# Patient Record
Sex: Male | Born: 1990 | Race: White | Hispanic: No | Marital: Single | Smoking: Current every day smoker
Health system: Southern US, Community
[De-identification: ages and names within clinical notes are randomized; demographics above are authoritative.]

## PROBLEM LIST (undated history)

## (undated) HISTORY — PX: OTHER SURGICAL HISTORY: SHX169

---

## 2010-08-26 ENCOUNTER — Emergency Department (HOSPITAL_COMMUNITY)
Admission: EM | Admit: 2010-08-26 | Discharge: 2010-08-26 | Disposition: A | Discharge status: Home / Self Care | Payer: Medicaid Other | Attending: Emergency Medicine | Admitting: Emergency Medicine

## 2010-08-26 DIAGNOSIS — M545 Low back pain, unspecified: Secondary | ICD-10-CM | POA: Insufficient documentation

## 2010-08-26 DIAGNOSIS — M5430 Sciatica, unspecified side: Secondary | ICD-10-CM | POA: Insufficient documentation

## 2010-08-27 ENCOUNTER — Emergency Department (HOSPITAL_COMMUNITY)
Admission: EM | Admit: 2010-08-27 | Discharge: 2010-08-28 | Disposition: A | Discharge status: Home / Self Care | Payer: Medicaid Other | Attending: Emergency Medicine | Admitting: Emergency Medicine

## 2010-08-27 DIAGNOSIS — M545 Low back pain, unspecified: Secondary | ICD-10-CM | POA: Insufficient documentation

## 2010-08-27 DIAGNOSIS — M79609 Pain in unspecified limb: Secondary | ICD-10-CM | POA: Insufficient documentation

## 2010-08-27 DIAGNOSIS — M5430 Sciatica, unspecified side: Secondary | ICD-10-CM | POA: Insufficient documentation

## 2010-08-27 DIAGNOSIS — Z79899 Other long term (current) drug therapy: Secondary | ICD-10-CM | POA: Insufficient documentation

## 2010-08-27 DIAGNOSIS — M5380 Other specified dorsopathies, site unspecified: Secondary | ICD-10-CM | POA: Insufficient documentation

## 2010-08-27 DIAGNOSIS — R209 Unspecified disturbances of skin sensation: Secondary | ICD-10-CM | POA: Insufficient documentation

## 2010-09-04 ENCOUNTER — Other Ambulatory Visit (HOSPITAL_COMMUNITY): Payer: Self-pay | Admitting: Specialist

## 2010-09-04 DIAGNOSIS — M5126 Other intervertebral disc displacement, lumbar region: Secondary | ICD-10-CM

## 2010-09-05 ENCOUNTER — Ambulatory Visit (HOSPITAL_COMMUNITY)
Admission: RE | Admit: 2010-09-05 | Discharge: 2010-09-05 | Disposition: A | Discharge status: Home / Self Care | Payer: Medicaid Other | Source: Ambulatory Visit | Attending: Specialist | Admitting: Specialist

## 2010-09-05 ENCOUNTER — Other Ambulatory Visit (HOSPITAL_COMMUNITY): Payer: Self-pay | Admitting: Specialist

## 2010-09-05 ENCOUNTER — Emergency Department (HOSPITAL_COMMUNITY)
Admission: EM | Admit: 2010-09-05 | Discharge: 2010-09-05 | Disposition: A | Discharge status: Home / Self Care | Payer: Medicaid Other | Attending: Emergency Medicine | Admitting: Emergency Medicine

## 2010-09-05 DIAGNOSIS — M545 Low back pain, unspecified: Secondary | ICD-10-CM | POA: Insufficient documentation

## 2010-09-05 DIAGNOSIS — R209 Unspecified disturbances of skin sensation: Secondary | ICD-10-CM | POA: Insufficient documentation

## 2010-09-05 DIAGNOSIS — M5126 Other intervertebral disc displacement, lumbar region: Secondary | ICD-10-CM

## 2010-09-05 DIAGNOSIS — M519 Unspecified thoracic, thoracolumbar and lumbosacral intervertebral disc disorder: Secondary | ICD-10-CM | POA: Insufficient documentation

## 2010-09-05 DIAGNOSIS — M79609 Pain in unspecified limb: Secondary | ICD-10-CM | POA: Insufficient documentation

## 2010-09-05 DIAGNOSIS — R29898 Other symptoms and signs involving the musculoskeletal system: Secondary | ICD-10-CM | POA: Insufficient documentation

## 2010-09-05 DIAGNOSIS — E669 Obesity, unspecified: Secondary | ICD-10-CM | POA: Insufficient documentation

## 2010-09-12 ENCOUNTER — Emergency Department (HOSPITAL_COMMUNITY)
Admission: EM | Admit: 2010-09-12 | Discharge: 2010-09-12 | Disposition: A | Discharge status: Home / Self Care | Payer: Medicaid Other | Attending: Emergency Medicine | Admitting: Emergency Medicine

## 2010-09-12 DIAGNOSIS — R000 Tachycardia, unspecified: Secondary | ICD-10-CM | POA: Insufficient documentation

## 2010-09-12 DIAGNOSIS — M545 Low back pain, unspecified: Secondary | ICD-10-CM | POA: Insufficient documentation

## 2010-09-12 DIAGNOSIS — E669 Obesity, unspecified: Secondary | ICD-10-CM | POA: Insufficient documentation

## 2010-09-12 DIAGNOSIS — R3916 Straining to void: Secondary | ICD-10-CM | POA: Insufficient documentation

## 2010-09-12 DIAGNOSIS — M5126 Other intervertebral disc displacement, lumbar region: Secondary | ICD-10-CM | POA: Insufficient documentation

## 2010-09-15 ENCOUNTER — Encounter (HOSPITAL_COMMUNITY)
Admission: RE | Admit: 2010-09-15 | Discharge: 2010-09-15 | Disposition: A | Discharge status: Home / Self Care | Payer: Medicaid Other | Source: Ambulatory Visit | Attending: Neurological Surgery | Admitting: Neurological Surgery

## 2010-09-15 LAB — BASIC METABOLIC PANEL
CO2: 32 mEq/L (ref 19–32)
Chloride: 101 mEq/L (ref 96–112)
Creatinine, Ser: 0.94 mg/dL (ref 0.4–1.5)
GFR calc Af Amer: >60 mL/min (ref >60)
Glucose, Bld: 101 mg/dL — ABNORMAL HIGH (ref 70–99)

## 2010-09-15 LAB — DIFFERENTIAL
Basophils Relative: 1 % (ref 0–1)
Eosinophils Relative: 5 % (ref 0–5)
Lymphs Abs: 2.8 10*3/uL (ref 0.7–4.0)
Monocytes Relative: 13 % — ABNORMAL HIGH (ref 3–12)
Neutro Abs: 3.7 10*3/uL (ref 1.7–7.7)

## 2010-09-15 LAB — CBC
HCT: 40.4 % (ref 39.0–52.0)
Hemoglobin: 14.7 g/dL (ref 13.0–17.0)
MCH: 30.9 pg (ref 26.0–34.0)
MCV: 85.1 fL (ref 78.0–100.0)
RBC: 4.75 MIL/uL (ref 4.22–5.81)

## 2010-09-15 LAB — SURGICAL PCR SCREEN: MRSA, PCR: NEGATIVE

## 2010-09-15 LAB — PROTIME-INR: INR: 0.99 (ref 0.00–1.49)

## 2010-09-18 ENCOUNTER — Ambulatory Visit (HOSPITAL_COMMUNITY): Payer: Medicaid Other

## 2010-09-18 ENCOUNTER — Ambulatory Visit (HOSPITAL_COMMUNITY)
Admission: RE | Admit: 2010-09-18 | Discharge: 2010-09-18 | Disposition: A | Discharge status: Home / Self Care | Payer: Medicaid Other | Source: Ambulatory Visit | Attending: Neurological Surgery | Admitting: Neurological Surgery

## 2010-09-18 DIAGNOSIS — M5126 Other intervertebral disc displacement, lumbar region: Secondary | ICD-10-CM | POA: Insufficient documentation

## 2010-09-18 DIAGNOSIS — E669 Obesity, unspecified: Secondary | ICD-10-CM | POA: Insufficient documentation

## 2010-09-18 DIAGNOSIS — Z01812 Encounter for preprocedural laboratory examination: Secondary | ICD-10-CM | POA: Insufficient documentation

## 2010-09-18 DIAGNOSIS — F17200 Nicotine dependence, unspecified, uncomplicated: Secondary | ICD-10-CM | POA: Insufficient documentation

## 2010-09-22 NOTE — Consult Note (Signed)
NAMEEMILY, FORSE NO.:  [id number]  MEDICAL RECORD NO.:  [id number]           PATIENT TYPE:  LOCATION:                                 FACILITY:  PHYSICIAN:  Tia Alert, MD     DATE OF BIRTH:  18-May-1991  DATE OF CONSULTATION:  09/05/2010 DATE OF DISCHARGE:                                CONSULTATION   CHIEF COMPLAINT:  Large lumbar disk herniation.  HISTORY OF PRESENT ILLNESS:  Mr. Brault is a 20 year old gentleman who is seen in neurosurgical consultation requested by the physician in the emergency department regarding a large lumbar disk herniation.  The patient states that he started hurting about a month ago in his back and in his legs.  The pain is somewhat progressive.  He has had some paresthesias, but no weakness, no bowel and bladder changes.  He has had no numbness in his perineum, no difficulty with urination or ambulation. He had an MRI which showed a large disk protrusion centrally at L4-5 with a free fragment causing severe spinal stenosis at L4-5 and he was sent to the emergency department for evaluation by local orthopedic surgeon.  He has been taking Percocet without significant relief.  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  MEDICATIONS:  Percocet and Advil.  ALLERGIES:  No known drug allergies.  SOCIAL HISTORY:  Positive use of tobacco.  No alcohol or IV drug abuse.  PHYSICAL EXAMINATION:  GENERAL:  Pleasant, cooperative, obese white male, in no acute distress, lying in stretcher. HEENT:  Normocephalic, atraumatic.  Extraocular movements are intact. NECK:  Supple. HEART:  Regular rhythm. EXTREMITIES:  No obvious deformities. NEUROLOGIC:  He is awake and alert, pleasant, interactive.  No aphasia. Good attention span.  His fund of knowledge and memory appear to be appropriate.  No facial  asymmetry.  Extraocular movements are intact. Pupils are equal and reactive.  His strength is 5/5 throughout with good muscle tone, good muscle bulk.  No evidence of EHL or dorsiflexion weakness.  Positive straight leg raise test.  Gait is not tested.  IMAGING STUDIES:  MRI of the lumbar spine shows a large central disk protrusion at L4-5.  There is disk desiccation at L4-5 and L5-S1.  There is significant spinal stenosis at L4-5.  There is a large free fragment, more paracentral to the right.  ASSESSMENT AND PLAN:  I do believe this 20 year old gentleman unfortunately needs surgical intervention.  I do not think he needs emergency or urgent surgery, and therefore we are going to let him go home and follow up with me to schedule surgery in the next week or two. He is to call me should he develop any weakness in his lower extremities or any signs of cauda equina syndrome, which has been explained to him in depth.  We have talked about the surgery in general.  We have talked about typical outcomes and recovery times.  We have talked about activity modifications after surgery  and work restrictions.  We have talked about the risk of the surgery which include but are not limited to bleeding, infection, nerve root injury, CSF leak, numbness, weakness, possible need for further surgery, lack of relief of pain, and anesthesia risk including DVT and death.  He understands these things and wishes to proceed, and he will be called upon my office in the next few days to schedule.     Tia Alert, MD     DSJ/MEDQ  D:  09/05/2010  T:  09/06/2010  Job:  [id number]  Electronically Signed by Marikay Alar MD on 09/22/2010 10:46:35 AM

## 2010-09-22 NOTE — Op Note (Signed)
NAMECALEY, VOLKERT               ACCOUNT NO.:  [id number]  MEDICAL RECORD NO.:  [id number]           PATIENT TYPE:  I  LOCATION:  3524                         FACILITY:  [facility]  PHYSICIAN:  Tia Alert, MD     DATE OF BIRTH:  Nov 27, 1990  DATE OF PROCEDURE:  09/18/2010 DATE OF DISCHARGE:  09/18/2010                              OPERATIVE REPORT   PREOPERATIVE DIAGNOSIS:  Large L4-5 herniated disk with severe spinal stenosis at L4-5 with back and right much greater than left leg pain.  POSTOPERATIVE DIAGNOSIS:  Large L4-5 herniated disk with severe spinal stenosis at L4-5 with back and right much greater than left leg pain.  PROCEDURES:  Right L4-5 hemilaminectomy, medial facetectomy, foraminotomy followed by microdiskectomy at L4-5 on the right utilizing microscopic dissection.  SURGEON:  Tia Alert, MD  ASSISTANT:  Reinaldo Meeker, MD  ANESTHESIA:  General endotracheal.  COMPLICATIONS:  None apparent.  INDICATIONS FOR PROCEDURE:  Mr. Haig is a 20 year old gentleman who presented to the emergency department with severe back and leg pain.  He had right greater than left leg pain.  He had an MRI which showed a large herniated disk at L4-5 with a large free fragment causing severe spinal stenosis at L4-5.  He was originally taken care of by orthopedic surgeons but they called and asked me to assume his care because they felt he needed surgical intervention.  Given the severity of his stenosis and the length of his pain, I agreed that he was best treated by surgical intervention even given his young age.  At first, I decided on bilateral hemilaminectomy with a right-sided microdiskectomy, however, intraoperatively, once we removed the large midline free fragment, the dura relaxed, the annulus sagged, and we felt like we had an adequate decompression of the canal and the nerve roots with simple microdiskectomy from the right side.  The patient did understand  the risks, benefits, and alternatives prior to proceeding and wished to proceed.  DESCRIPTION OF THE PROCEDURE:  The patient was taken to the operating room.  After induction of adequate generalized endotracheal anesthesia, he was rolled in prone position on the Wilson frame.  All pressure points were padded.  His lumbar region was prepped with DuraPrep and then draped in the usual sterile fashion.  A 5 mL of local anesthesia was injected and a dorsal midline incision was made over L4-5 and carried down to the lumbosacral fascia.  The fascia was opened on both sides and taken down in a subperiosteal fashion to expose L4-5. Intraoperative x-ray confirmed our level.  I had planned to do bilateral hemilaminectomies but I started on the right-hand side and utilizing the high-speed drill and the Kerrison punches, performed a hemilaminectomy, medial facetectomy with foraminotomy at L4-5 on the right.  The underlying yellow ligament was opened and removed, undercut the lateral recess, performed a generous foraminotomy marching down to this distal part of the pedicle.  There was an obvious large disk herniation that was noted that was pushing the dura medially and superiorly.  I was able to open this with a nerve hook and then  remove a very large fragment from the midline and right paracentral region and the dura immediately relaxed.  There was a large annular tear and hole.  I was also able to perform a thorough intradiscal diskectomy through this utilizing the upbiting pituitary rongeurs as well as straight pituitaries.  I was able to bring down fragments from the midline with an Epstein curette.  Once the diskectomy was done, the annulus in the midline was sagging.  We could pass a coronary dilator and a hockey stick across the midline and felt no significant compression of thecal sac from the midline and therefore, we decided that given his young age, we would like to avoid a bilateral  hemilaminectomy and diskectomy on him and felt like the removal of this large free fragment was probably the thing that was causing the most severe spinal stenosis and felt like we probably had an adequate decompression of the canal and the nerve roots with the simple microdiskectomy from the right side and therefore, felt it was probably in his best interest to only do on one side.  We irrigated with saline solution containing bacitracin.  Dried all bleeding points, lined the dura with Gelfoam, then closed the fascia with 0 Vicryl closing subcutaneous tissues with layers of 2-0 and 0 Vicryl and closed subcuticular tissues with 3-0 Vicryl and closed the skin with Benzoin and Steri-Strips.  The drapes were removed.  Sterile dressing was applied.  The patient was awakened from general anesthesia and transferred to recovery room in stable condition.  At the end of the procedure, all sponge, needle, and instrument counts were correct.     Tia Alert, MD     DSJ/MEDQ  D:  09/18/2010  T:  09/19/2010  Job:  [id number]  Electronically Signed by Marikay Alar MD on 09/22/2010 10:46:37 AM

## 2010-12-08 ENCOUNTER — Emergency Department (HOSPITAL_COMMUNITY): Payer: Medicaid Other

## 2010-12-08 ENCOUNTER — Emergency Department (HOSPITAL_COMMUNITY)
Admission: EM | Admit: 2010-12-08 | Discharge: 2010-12-08 | Disposition: A | Discharge status: Home / Self Care | Payer: Medicaid Other | Attending: Emergency Medicine | Admitting: Emergency Medicine

## 2010-12-08 DIAGNOSIS — K429 Umbilical hernia without obstruction or gangrene: Secondary | ICD-10-CM | POA: Insufficient documentation

## 2010-12-08 DIAGNOSIS — R05 Cough: Secondary | ICD-10-CM | POA: Insufficient documentation

## 2010-12-08 DIAGNOSIS — E669 Obesity, unspecified: Secondary | ICD-10-CM | POA: Insufficient documentation

## 2010-12-08 DIAGNOSIS — R059 Cough, unspecified: Secondary | ICD-10-CM | POA: Insufficient documentation

## 2010-12-09 ENCOUNTER — Emergency Department (HOSPITAL_COMMUNITY): Payer: Medicaid Other

## 2010-12-09 ENCOUNTER — Emergency Department (HOSPITAL_COMMUNITY)
Admission: EM | Admit: 2010-12-09 | Discharge: 2010-12-09 | Disposition: A | Discharge status: Home / Self Care | Payer: Medicaid Other | Attending: Emergency Medicine | Admitting: Emergency Medicine

## 2010-12-09 DIAGNOSIS — K439 Ventral hernia without obstruction or gangrene: Secondary | ICD-10-CM | POA: Insufficient documentation

## 2010-12-09 DIAGNOSIS — E669 Obesity, unspecified: Secondary | ICD-10-CM | POA: Insufficient documentation

## 2010-12-09 DIAGNOSIS — R1033 Periumbilical pain: Secondary | ICD-10-CM | POA: Insufficient documentation

## 2010-12-09 LAB — DIFFERENTIAL
Basophils Absolute: 0.1 10*3/uL (ref 0.0–0.1)
Basophils Relative: 1 % (ref 0–1)
Neutro Abs: 8 10*3/uL — ABNORMAL HIGH (ref 1.7–7.7)
Neutrophils Relative %: 60 % (ref 43–77)

## 2010-12-09 LAB — CBC
Hemoglobin: 15.4 g/dL (ref 13.0–17.0)
RBC: 5 MIL/uL (ref 4.22–5.81)

## 2010-12-09 LAB — POCT I-STAT, CHEM 8
BUN: 13 mg/dL (ref 6–23)
Hemoglobin: 15.6 g/dL (ref 13.0–17.0)
Sodium: 141 mEq/L (ref 135–145)
TCO2: 27 mmol/L (ref 0–100)

## 2010-12-09 MED ORDER — IOHEXOL 300 MG/ML  SOLN
100.0000 mL | Freq: Once | INTRAMUSCULAR | Status: AC | PRN
  Administered 2010-12-09: 100 mL via INTRAVENOUS

## 2011-01-07 ENCOUNTER — Other Ambulatory Visit: Payer: Self-pay | Admitting: Neurological Surgery

## 2011-01-07 DIAGNOSIS — M545 Low back pain: Secondary | ICD-10-CM

## 2011-01-12 ENCOUNTER — Ambulatory Visit
Admission: RE | Admit: 2011-01-12 | Discharge: 2011-01-12 | Disposition: A | Discharge status: Home / Self Care | Payer: Medicaid Other | Source: Ambulatory Visit | Attending: Neurological Surgery | Admitting: Neurological Surgery

## 2011-01-12 DIAGNOSIS — M545 Low back pain, unspecified: Secondary | ICD-10-CM

## 2011-01-12 MED ORDER — GADOBENATE DIMEGLUMINE 529 MG/ML IV SOLN
20.0000 mL | Freq: Once | INTRAVENOUS | Status: AC | PRN
  Administered 2011-01-12: 20 mL via INTRAVENOUS

## 2013-04-14 IMAGING — CT CT ABD-PELV W/ CM
3 of 4 series · 15 of 32 positions shown, 19 images · IV contrast (water & 100ml omni 300)
Comparison: Lumbar MRI 09/05/2010.

CLINICAL DATA: 20-year-old male with abdominal pain.  Oozing from
anterior abdominal wall.

CT ABDOMEN AND PELVIS WITH CONTRAST
TECHNIQUE: Multidetector CT imaging of the abdomen and pelvis was
performed following the standard protocol during bolus
administration of intravenous contrast.
Contrast: 100 ml Imnipaque-HTT.

[Series 2: routine abdomen · axial · 0.86mm/px · z∈[-407,-182]mm · 3 of 91 slices shown, 7 images]
[im 23/91  soft-tissue]
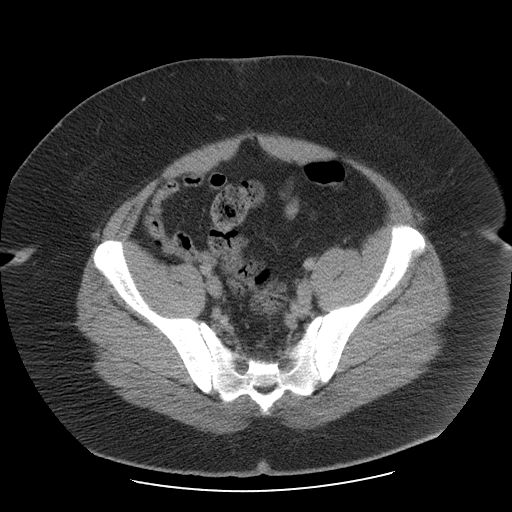
[im 23/91  lung]
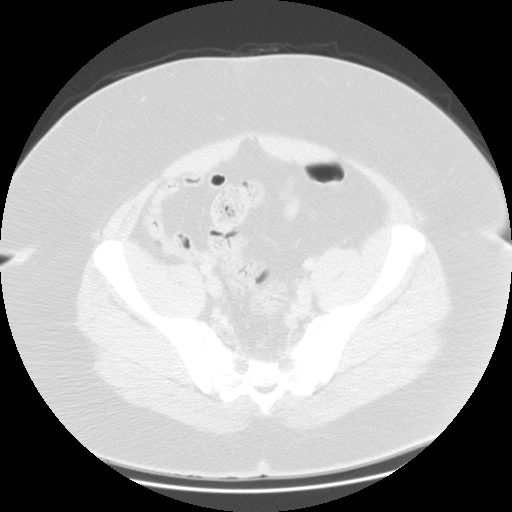
[im 23/91  bone]
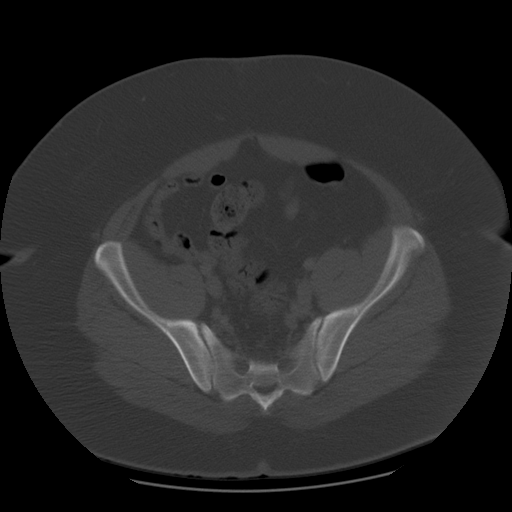
[im 46/91  soft-tissue]
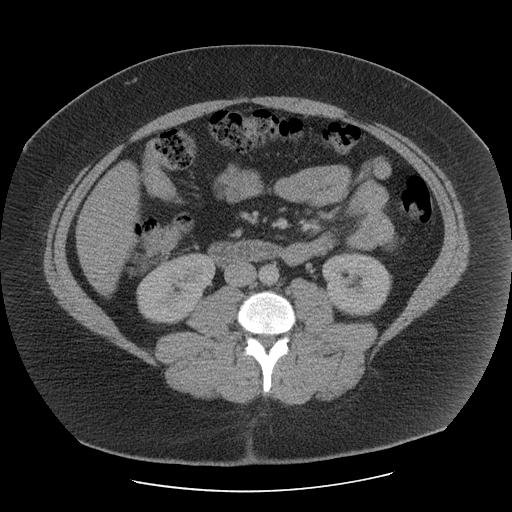
[im 46/91  lung]
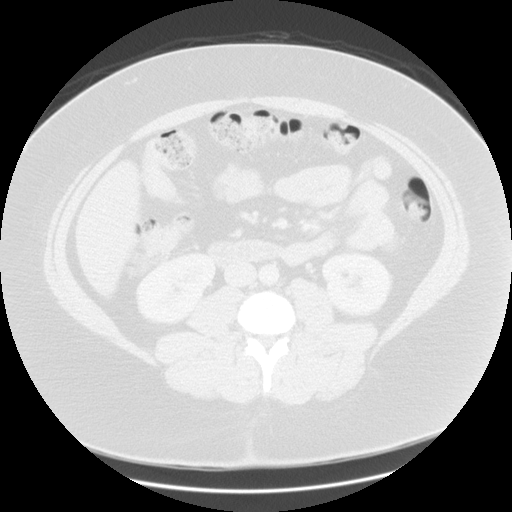
[im 68/91  soft-tissue]
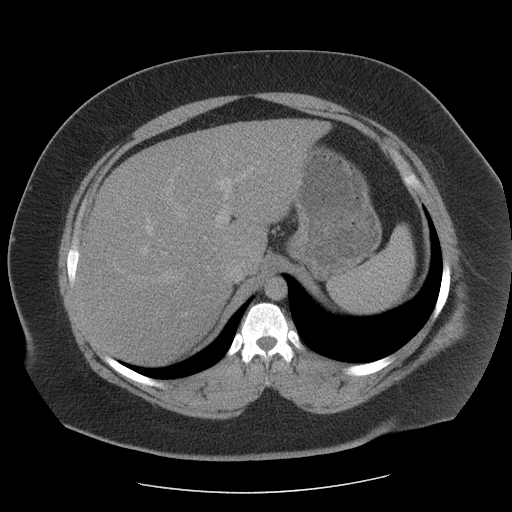
[im 68/91  lung]
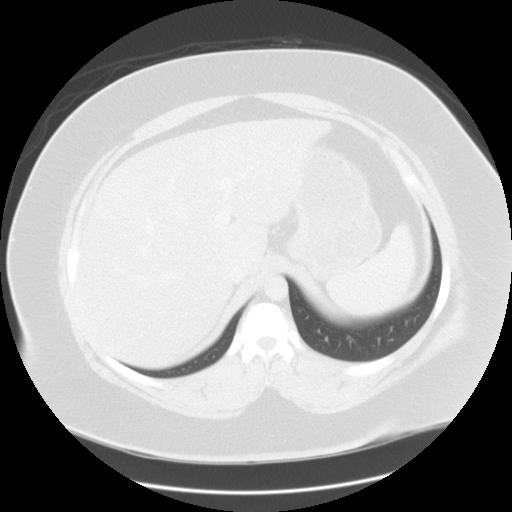

[Series 400: cor · coronal · 1.00mm/px · 4 of 203 slices shown]
[im 19/203  soft-tissue]
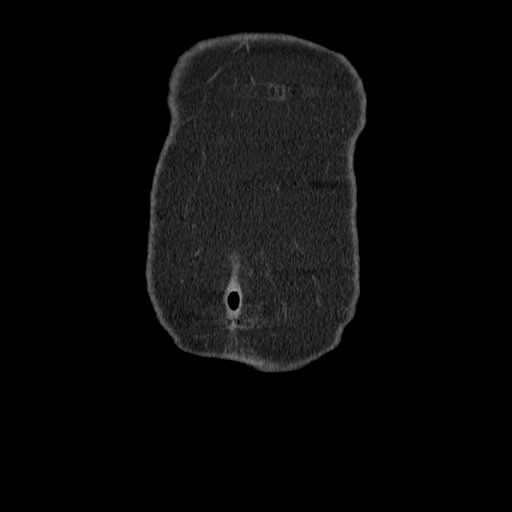
[im 37/203  soft-tissue]
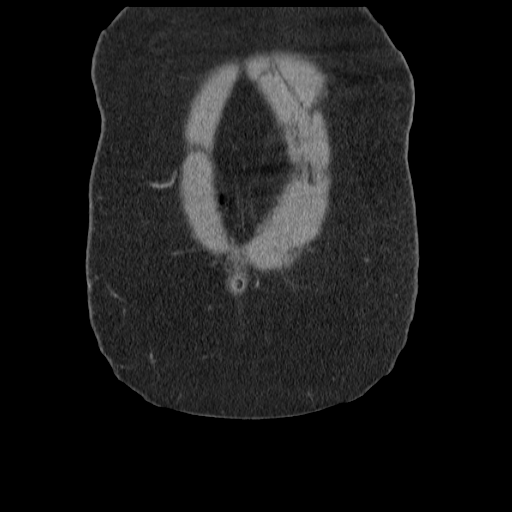
[im 74/203  soft-tissue]
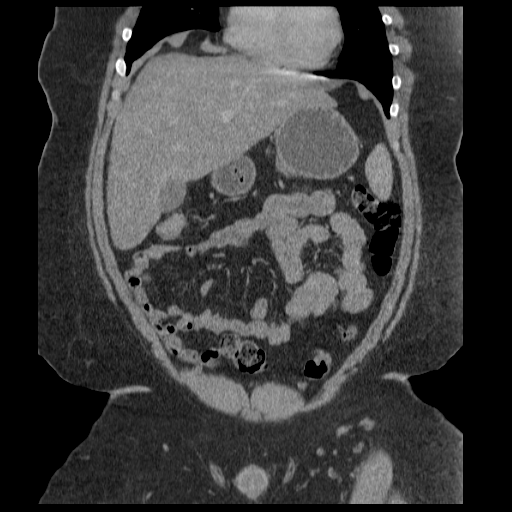
[im 92/203  soft-tissue]
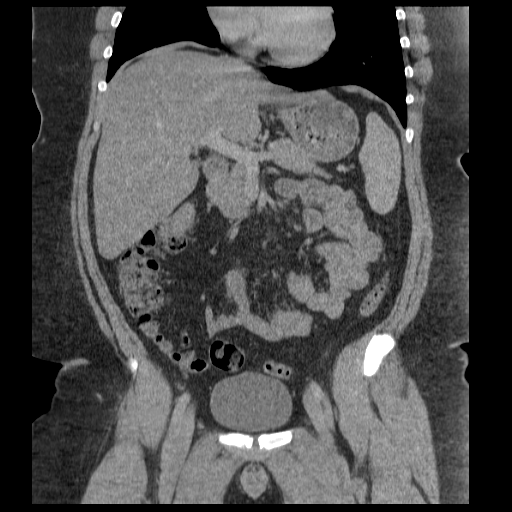

[Series 401: sag · sagittal · 1.00mm/px · 8 of 215 slices shown]
[im 18/215  soft-tissue]
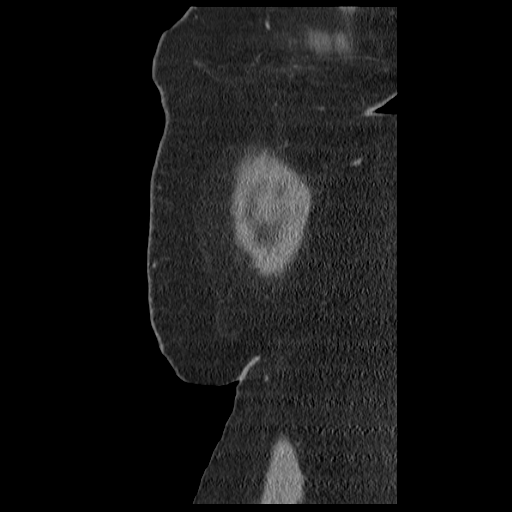
[im 54/215  soft-tissue]
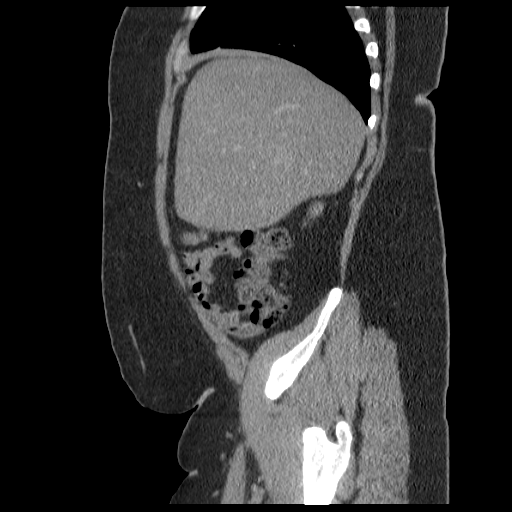
[im 72/215  soft-tissue]
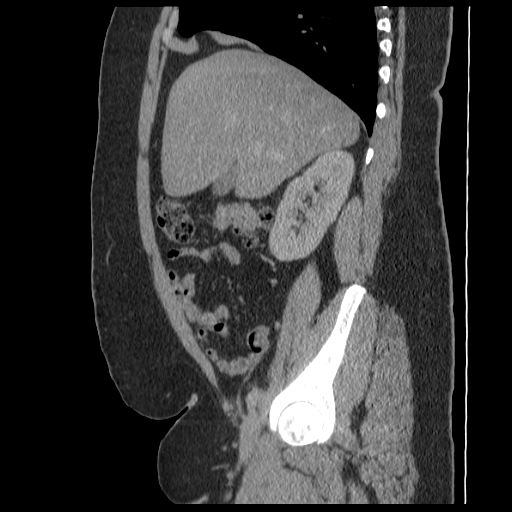
[im 90/215  soft-tissue]
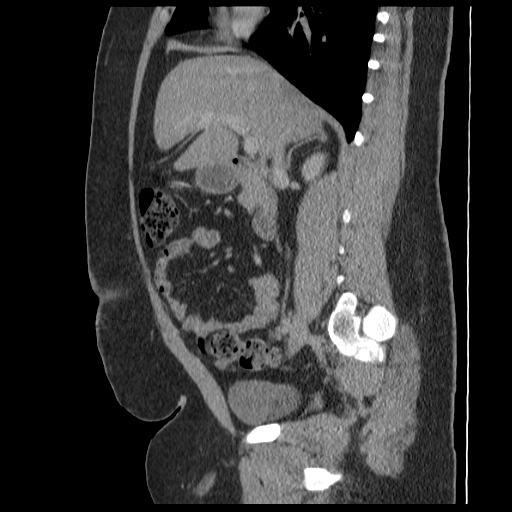
[im 125/215  soft-tissue]
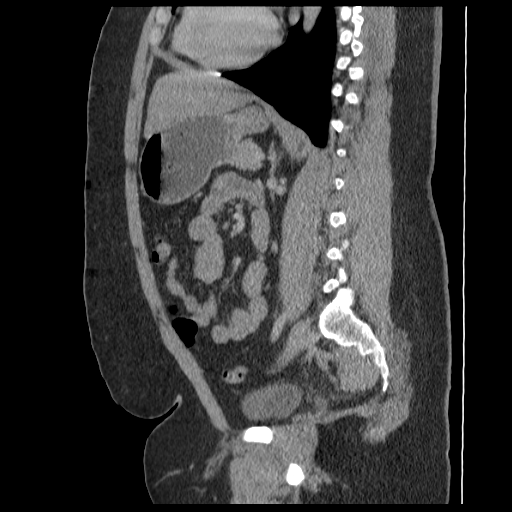
[im 143/215  soft-tissue]
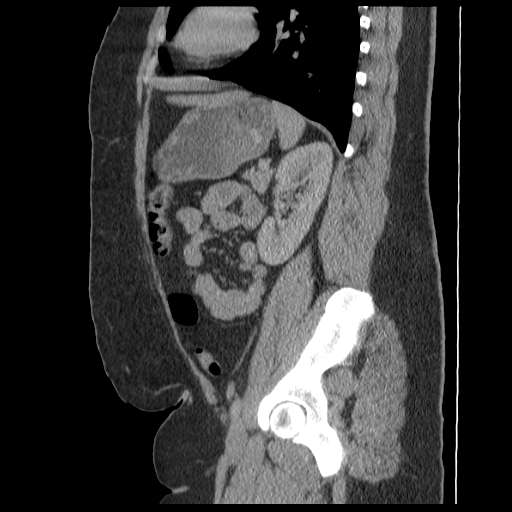
[im 161/215  soft-tissue]
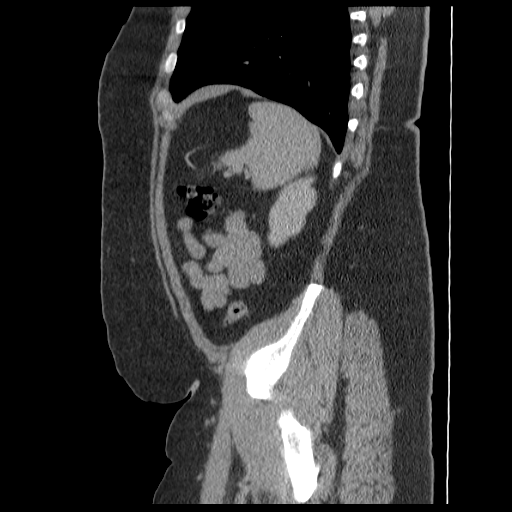
[im 197/215  soft-tissue]
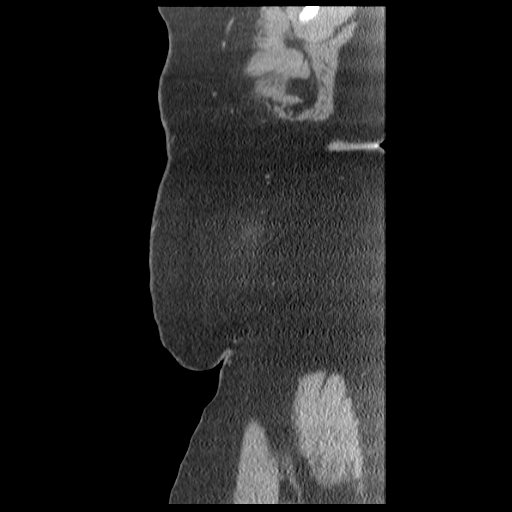

[15 of 32 positions shown; findings below may reference images not displayed]

FINDINGS: Large body habitus.  Clear lung bases.  Lower lumbar disc
degeneration. No acute osseous abnormality identified.
Postoperative changes on the right at L4-L5.  No pelvic free fluid.
Bladder unremarkable.  Stool in the distal colon.  Redundant
sigmoid.  Normal proximal colon and appendix.  No dilated small
bowel.  Negative noncontrast stomach, duodenum, spleen, pancreas,
adrenal glands, and kidneys.

Mild diffuse decreased density in the liver.  No focal liver
lesion.  Gallbladder decompressed.

No ventral abdominal hernia identified.  Mild fat stranding noted
superficially about the umbilicus (sagittal image 95).  No fluid
collection.  No definite involvement of the ventral abdominal
musculature or fascia.  No abdominal free fluid or
pneumoperitoneum.
IMPRESSION: 1.  Mild stranding in the subcutaneous fat about the umbilicus,
favor cellulitis.  No fluid collection.
2.  No abdominal wall or intra-abdominal involvement identified.
3.  Hepatic steatosis, otherwise negative noncontrast CT of the
abdomen and pelvis.

## 2014-10-24 ENCOUNTER — Emergency Department (HOSPITAL_COMMUNITY)
Admission: EM | Admit: 2014-10-24 | Discharge: 2014-10-25 | Disposition: A | Discharge status: Home / Self Care | Payer: BLUE CROSS/BLUE SHIELD | Attending: Emergency Medicine | Admitting: Emergency Medicine

## 2014-10-24 ENCOUNTER — Emergency Department (HOSPITAL_COMMUNITY): Payer: BLUE CROSS/BLUE SHIELD

## 2014-10-24 ENCOUNTER — Encounter (HOSPITAL_COMMUNITY): Payer: Self-pay | Admitting: *Deleted

## 2014-10-24 DIAGNOSIS — R61 Generalized hyperhidrosis: Secondary | ICD-10-CM | POA: Insufficient documentation

## 2014-10-24 DIAGNOSIS — R110 Nausea: Secondary | ICD-10-CM | POA: Insufficient documentation

## 2014-10-24 DIAGNOSIS — R51 Headache: Secondary | ICD-10-CM | POA: Diagnosis not present

## 2014-10-24 DIAGNOSIS — Z720 Tobacco use: Secondary | ICD-10-CM | POA: Insufficient documentation

## 2014-10-24 DIAGNOSIS — R0781 Pleurodynia: Secondary | ICD-10-CM | POA: Insufficient documentation

## 2014-10-24 DIAGNOSIS — R109 Unspecified abdominal pain: Secondary | ICD-10-CM | POA: Diagnosis present

## 2014-10-24 DIAGNOSIS — R0602 Shortness of breath: Secondary | ICD-10-CM | POA: Insufficient documentation

## 2014-10-24 DIAGNOSIS — R0789 Other chest pain: Secondary | ICD-10-CM | POA: Insufficient documentation

## 2014-10-24 LAB — COMPREHENSIVE METABOLIC PANEL
ALBUMIN: 4.1 g/dL (ref 3.5–5.0)
ALT: 51 U/L (ref 17–63)
ANION GAP: 11 (ref 5–15)
AST: 29 U/L (ref 15–41)
Alkaline Phosphatase: 63 U/L (ref 38–126)
BILIRUBIN TOTAL: 0.3 mg/dL (ref 0.3–1.2)
BUN: 13 mg/dL (ref 6–20)
CALCIUM: 9.4 mg/dL (ref 8.9–10.3)
CHLORIDE: 101 mmol/L (ref 101–111)
CO2: 26 mmol/L (ref 22–32)
CREATININE: 0.9 mg/dL (ref 0.61–1.24)
GFR calc non Af Amer: >60 mL/min (ref >60)
Glucose, Bld: 95 mg/dL (ref 65–99)
POTASSIUM: 3.9 mmol/L (ref 3.5–5.1)
Sodium: 138 mmol/L (ref 135–145)
Total Protein: 8.1 g/dL (ref 6.5–8.1)

## 2014-10-24 LAB — URINALYSIS, ROUTINE W REFLEX MICROSCOPIC
BILIRUBIN URINE: NEGATIVE
Glucose, UA: NEGATIVE mg/dL
HGB URINE DIPSTICK: NEGATIVE
Ketones, ur: NEGATIVE mg/dL
Leukocytes, UA: NEGATIVE
NITRITE: NEGATIVE
PROTEIN: NEGATIVE mg/dL
SPECIFIC GRAVITY, URINE: 1.024 (ref 1.005–1.030)
UROBILINOGEN UA: 0.2 mg/dL (ref 0.0–1.0)
pH: 7 (ref 5.0–8.0)

## 2014-10-24 LAB — CBC WITH DIFFERENTIAL/PLATELET
Basophils Absolute: 0.1 10*3/uL (ref 0.0–0.1)
Basophils Relative: 1 % (ref 0–1)
EOS ABS: 0.4 10*3/uL (ref 0.0–0.7)
EOS PCT: 2 % (ref 0–5)
HCT: 45 % (ref 39.0–52.0)
HEMOGLOBIN: 15.4 g/dL (ref 13.0–17.0)
LYMPHS PCT: 26 % (ref 12–46)
Lymphs Abs: 4.7 10*3/uL — ABNORMAL HIGH (ref 0.7–4.0)
MCH: 29.6 pg (ref 26.0–34.0)
MCHC: 34.2 g/dL (ref 30.0–36.0)
MCV: 86.4 fL (ref 78.0–100.0)
MONO ABS: 1.5 10*3/uL — AB (ref 0.1–1.0)
MONOS PCT: 9 % (ref 3–12)
Neutro Abs: 11.1 10*3/uL — ABNORMAL HIGH (ref 1.7–7.7)
Neutrophils Relative %: 62 % (ref 43–77)
PLATELETS: 344 10*3/uL (ref 150–400)
RBC: 5.21 MIL/uL (ref 4.22–5.81)
RDW: 13.2 % (ref 11.5–15.5)
WBC: 17.7 10*3/uL — ABNORMAL HIGH (ref 4.0–10.5)

## 2014-10-24 LAB — LIPASE, BLOOD: LIPASE: 21 U/L — AB (ref 22–51)

## 2014-10-24 MED ORDER — METHOCARBAMOL 500 MG PO TABS
1000.0000 mg | ORAL_TABLET | Freq: Once | ORAL | Status: AC
  Administered 2014-10-24: 1000 mg via ORAL
  Filled 2014-10-24: qty 2

## 2014-10-24 NOTE — ED Provider Notes (Signed)
CSN: [id number]     Arrival date & time 10/24/14  2107 History  This chart was scribed for Marisa Severin, MD by Annye Asa, ED Scribe. This patient was seen in room D36C/D36C and the patient's care was started at 11:10 PM.    Chief Complaint  Patient presents with  . Flank Pain   The history is provided by the patient. No language interpreter was used.    HPI Comments: Jsiah Menta is a 24 y.o. male who presents to the Emergency Department complaining of sharp, sudden onset left lateral rib pain, radiating into his back, beginning today. Patient explains he was doing low-activity chores at home when he felt a sudden pain in his ribs; he notes associated headache, nausea, diaphoresis and mild SOB that resolved quickly. His pain is exacerbated with applied pressure, deep breathing and lifting the left arm; his pain has gradually improved throughout the day but not resolved. He took a BC powder without relief. He denies any leg pain or swelling, recent fevers or cough, constipation.   He is a 1ppd smoker; he denies EtOH or illicit drug use. 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   Prior surgical history includes lumbarectomy. Patient reports a familial history of heart problems but is unable to specify any history of blood clots in the legs or lungs.   History reviewed. No pertinent past medical history. Past Surgical History  Procedure Laterality Date  . Lumbarectomy     No family history on file. History  Substance Use Topics  . Smoking status: Current Every Day Smoker -- 1.00 packs/day    Types: Cigarettes  . Smokeless tobacco: Not on file  . Alcohol Use: No    Review of Systems  Constitutional: Positive for diaphoresis.  Respiratory: Positive for shortness of breath.   Gastrointestinal: Positive for nausea.  Musculoskeletal:       Left lateral rib pain  Neurological: Positive for headaches.  All other systems reviewed and are  negative.    Allergies  Review of patient's allergies indicates no known allergies.  Home Medications   Prior to Admission medications   Medication Sig Start Date End Date Taking? Authorizing Provider  Aspirin-Salicylamide-Caffeine (BC HEADACHE POWDER PO) Take 1 Package by mouth daily as needed (pain).   Yes Historical Provider, MD   BP 109/74 mmHg  Pulse 84  Temp(Src) 98.4 F (36.9 C) (Oral)  Resp 18  Ht  (1.753 m)  Wt 375 lb (170.099 kg)  BMI 55.35 kg/m2  SpO2 97% Physical Exam  Constitutional: He is oriented to person, place, and time. He appears well-developed and well-nourished. No distress.  HENT:  Head: Normocephalic and atraumatic.  Moist mucous membranes  Eyes: EOM are normal. Pupils are equal, round, and reactive to light.  Neck: Normal range of motion. Neck supple. No JVD present.  Cardiovascular: Normal rate, regular rhythm and normal heart sounds.   No murmur heard. Pulmonary/Chest: Effort normal and breath sounds normal. No respiratory distress. He has no wheezes. He has no rales.  Tender along left chest wall without crepitus. Lungs are normal.   Abdominal: Soft. Bowel sounds are normal. He exhibits no mass. There is no tenderness. There is no rebound and no guarding.  No CVA tenderness, no abdominal tenderness.   Musculoskeletal: Normal range of motion.  Moves all extremities normally.   Neurological: He is alert and oriented to person, place, and time.  Skin: Skin is warm and dry. No  rash noted.  Psychiatric: He has a normal mood and affect. His behavior is normal.  Nursing note and vitals reviewed.   ED Course  Procedures   DIAGNOSTIC STUDIES: Oxygen Saturation is 97% on RA, adequate by my interpretation.    COORDINATION OF CARE: 11:16 PM Discussed treatment plan with pt at bedside and pt agreed to plan.   Labs Review Labs Reviewed  CBC WITH DIFFERENTIAL/PLATELET - Abnormal; Notable for the following:    WBC 17.7 (*)    Neutro Abs 11.1  (*)    Lymphs Abs 4.7 (*)    Monocytes Absolute 1.5 (*)    All other components within normal limits  LIPASE, BLOOD - Abnormal; Notable for the following:    Lipase 21 (*)    All other components within normal limits  URINALYSIS, ROUTINE W REFLEX MICROSCOPIC (NOT AT [facility]) - Abnormal; Notable for the following:    APPearance CLOUDY (*)    All other components within normal limits  COMPREHENSIVE METABOLIC PANEL  D-DIMER, QUANTITATIVE (NOT AT [facility])    Imaging Review Dg Chest 2 View  10/25/2014   CLINICAL DATA:  Acute onset of left lateral chest pain. Initial encounter.  EXAM: CHEST  2 VIEW  COMPARISON:  Chest radiograph performed 12/08/2010  FINDINGS: There is elevation of the right hemidiaphragm. Pulmonary vascularity is at the upper limits of normal. Mild bibasilar atelectasis is noted. No pleural effusion or pneumothorax is seen.  The heart is normal in size. No acute osseous abnormalities are identified.  IMPRESSION: Elevation of the right hemidiaphragm. Mild bibasilar atelectasis noted.   Electronically Signed   By: Roanna RaiderJeffery  Chang M.D.   On: 10/25/2014 00:30     EKG Interpretation None      MDM   Final diagnoses:  Left-sided chest wall pain    I personally performed the services described in this documentation, which was scribed in my presence. The recorded information has been reviewed and is accurate.  24 year old male with acute onset of left chest and flank pain.  Patient drives long distances work, will add d-dimer to rule out PE.  Patient have chest x-ray.  Leukocytosis noted.  Also treat with Robaxin help with possible musculoskeletal origin.  Chest x-ray and d-dimer, both negative.  Patient feeling better after Robaxin.  Patient updated on findings and plan.     Marisa Severinlga Jaisha Villacres, MD 10/25/14 [phone]

## 2014-10-24 NOTE — ED Notes (Signed)
MD at bedside. 

## 2014-10-24 NOTE — ED Notes (Signed)
Pt c/o left flank pain since this morning. Also c/o nausea with no nausea. Also reports having the feeling of needing to urinate frequently.

## 2014-10-25 LAB — D-DIMER, QUANTITATIVE (NOT AT ARMC)

## 2014-10-25 MED ORDER — METHOCARBAMOL 750 MG PO TABS: 750.0000 mg | ORAL_TABLET | Freq: Four times a day (QID) | ORAL | Status: AC

## 2014-10-25 MED ORDER — HYDROCODONE-ACETAMINOPHEN 5-325 MG PO TABS: 2.0000 | ORAL_TABLET | ORAL | Status: AC | PRN

## 2014-10-25 MED ORDER — NAPROXEN 500 MG PO TABS: 500.0000 mg | ORAL_TABLET | Freq: Two times a day (BID) | ORAL | Status: AC

## 2014-10-25 NOTE — Discharge Instructions (Signed)
Your workup today has not shown any signs of infection, blood clots in lungs, or other acute abnormalities.  Warm moist compresses to area of pain, hot water bottle or heating pad.  Take medications as prescribed.  Follow-up with your primary care doctor or list above.  If symptoms continue.   Chest Wall Pain Chest wall pain is pain in or around the bones and muscles of your chest. It may take up to 6 weeks to get better. It may take longer if you must stay physically active in your work and activities.  CAUSES  Chest wall pain may happen on its own. However, it may be caused by:  A viral illness like the flu.  Injury.  Coughing.  Exercise.  Arthritis.  Fibromyalgia.  Shingles. HOME CARE INSTRUCTIONS   Avoid overtiring physical activity. Try not to strain or perform activities that cause pain. This includes any activities using your chest or your abdominal and side muscles, especially if heavy weights are used.  Put ice on the sore area.  Put ice in a plastic bag.  Place a towel between your skin and the bag.  Leave the ice on for 15-20 minutes per hour while awake for the first 2 days.  Only take over-the-counter or prescription medicines for pain, discomfort, or fever as directed by your caregiver. SEEK IMMEDIATE MEDICAL CARE IF:   Your pain increases, or you are very uncomfortable.  You have a fever.  Your chest pain becomes worse.  You have new, unexplained symptoms.  You have nausea or vomiting.  You feel sweaty or lightheaded.  You have a cough with phlegm (sputum), or you cough up blood. MAKE SURE YOU:   Understand these instructions.  Will watch your condition.  Will get help right away if you are not doing well or get worse. Document Released: 05/11/2005 Document Revised: 08/03/2011 Document Reviewed: 01/05/2011 [facility] Patient Information 2015 [place], [place]. This information is not intended to replace advice given to you by your health care  provider. Make sure you discuss any questions you have with your health care provider.  Musculoskeletal Pain Musculoskeletal pain is muscle and boney aches and pains. These pains can occur in any part of the body. Your caregiver may treat you without knowing the cause of the pain. They may treat you if blood or urine tests, X-rays, and other tests were normal.  CAUSES There is often not a definite cause or reason for these pains. These pains may be caused by a type of germ (virus). The discomfort may also come from overuse. Overuse includes working out too hard when your body is not fit. Boney aches also come from weather changes. Bone is sensitive to atmospheric pressure changes. HOME CARE INSTRUCTIONS   Ask when your test results will be ready. Make sure you get your test results.  Only take over-the-counter or prescription medicines for pain, discomfort, or fever as directed by your caregiver. If you were given medications for your condition, do not drive, operate machinery or power tools, or sign legal documents for 24 hours. Do not drink alcohol. Do not take sleeping pills or other medications that may interfere with treatment.  Continue all activities unless the activities cause more pain. When the pain lessens, slowly resume normal activities. Gradually increase the intensity and duration of the activities or exercise.  During periods of severe pain, bed rest may be helpful. Lay or sit in any position that is comfortable.  Putting ice on the injured area.  Put  ice in a bag.  Place a towel between your skin and the bag.  Leave the ice on for 15 to 20 minutes, 3 to 4 times a day.  Follow up with your caregiver for continued problems and no reason can be found for the pain. If the pain becomes worse or does not go away, it may be necessary to repeat tests or do additional testing. Your caregiver may need to look further for a possible cause. SEEK IMMEDIATE MEDICAL CARE IF:  You have  pain that is getting worse and is not relieved by medications.  You develop chest pain that is associated with shortness or breath, sweating, feeling sick to your stomach (nauseous), or throw up (vomit).  Your pain becomes localized to the abdomen.  You develop any new symptoms that seem different or that concern you. MAKE SURE YOU:   Understand these instructions.  Will watch your condition.  Will get help right away if you are not doing well or get worse. Document Released: 05/11/2005 Document Revised: 08/03/2011 Document Reviewed: 01/13/2013 [facility] Patient Information 2015 [place], [place]. This information is not intended to replace advice given to you by your health care provider. Make sure you discuss any questions you have with your health care provider.

## 2014-10-31 ENCOUNTER — Other Ambulatory Visit: Payer: Self-pay | Admitting: Family Medicine

## 2014-10-31 DIAGNOSIS — R109 Unspecified abdominal pain: Secondary | ICD-10-CM

## 2014-11-01 ENCOUNTER — Other Ambulatory Visit: Payer: BLUE CROSS/BLUE SHIELD

## 2016-12-30 ENCOUNTER — Emergency Department
Admission: EM | Admit: 2016-12-30 | Discharge: 2016-12-30 | Disposition: A | Discharge status: Home / Self Care | Payer: BLUE CROSS/BLUE SHIELD | Attending: Emergency Medicine | Admitting: Emergency Medicine

## 2016-12-30 DIAGNOSIS — Y999 Unspecified external cause status: Secondary | ICD-10-CM | POA: Diagnosis not present

## 2016-12-30 DIAGNOSIS — F17210 Nicotine dependence, cigarettes, uncomplicated: Secondary | ICD-10-CM | POA: Diagnosis not present

## 2016-12-30 DIAGNOSIS — Y939 Activity, unspecified: Secondary | ICD-10-CM | POA: Diagnosis not present

## 2016-12-30 DIAGNOSIS — S30812A Abrasion of penis, initial encounter: Secondary | ICD-10-CM | POA: Diagnosis not present

## 2016-12-30 DIAGNOSIS — X58XXXA Exposure to other specified factors, initial encounter: Secondary | ICD-10-CM | POA: Insufficient documentation

## 2016-12-30 DIAGNOSIS — Y929 Unspecified place or not applicable: Secondary | ICD-10-CM | POA: Diagnosis not present

## 2016-12-30 DIAGNOSIS — N489 Disorder of penis, unspecified: Secondary | ICD-10-CM | POA: Diagnosis present

## 2016-12-30 NOTE — ED Provider Notes (Signed)
[facility] Emergency Department Provider Note    First MD Initiated Contact with Patient 12/30/16 [phone]     (approximate)  I have reviewed the triage vital signs and the nursing notes.   HISTORY  Chief Complaint Groin Swelling   HPI Erik Miller is a 26 y.o. male presents to the emergency department with complaint of bleeding from his penis which started during intercourse tonight. Patient states that he believes he is torn his frenulum. Patient states that bleeding is stopped at this time.   Past medical history None There are no active problems to display for this patient.   Past Surgical History:  Procedure Laterality Date  . lumbarectomy      Prior to Admission medications   Medication Sig Start Date End Date Taking? Authorizing Provider  Aspirin-Salicylamide-Caffeine (BC HEADACHE POWDER PO) Take 1 Package by mouth daily as needed (pain).    [provider]  HYDROcodone-acetaminophen (NORCO/VICODIN) 5-325 MG per tablet Take 2 tablets by mouth every 4 (four) hours as needed. 10/25/14   Marisa Severintter, Olga, MD  methocarbamol (ROBAXIN-750) 750 MG tablet Take 1 tablet (750 mg total) by mouth 4 (four) times daily. 10/25/14   Marisa Severintter, Olga, MD  naproxen (NAPROSYN) 500 MG tablet Take 1 tablet (500 mg total) by mouth 2 (two) times daily. 10/25/14   Marisa Severintter, Olga, MD    Allergies No known drug allergies No family history on file.  Social History Social History  Substance Use Topics  . Smoking status: Current Every Day Smoker    Packs/day: 1.00    Types: Cigarettes  . Smokeless tobacco: Not on file  . Alcohol use No    Review of Systems Constitutional: No fever/chills Eyes: No visual changes. ENT: No sore throat. Cardiovascular: Denies chest pain. Respiratory: Denies shortness of breath. Gastrointestinal: No abdominal pain.  No nausea, no vomiting.  No diarrhea.  No constipation. Genitourinary: Negative for dysuria.Positive for penile  abrasion Musculoskeletal: Negative for neck pain.  Negative for back pain. Integumentary: Negative for rash. Neurological: Negative for headaches, focal weakness or numbness.  ____________________________________________   PHYSICAL EXAM:  VITAL SIGNS: ED Triage Vitals  Enc Vitals Group     BP 12/30/16 0023 (!) 177/97     Pulse Rate 12/30/16 0023 95     Resp 12/30/16 0023 20     Temp 12/30/16 0023 98.9 F (37.2 C)     Temp Source 12/30/16 0023 Oral     SpO2 12/30/16 0023 100 %     Weight 12/30/16 0022 (!) 172.4 kg (380 lb)     Height 12/30/16 0022 1.727 m (5\' 8" )     Head Circumference --      Peak Flow --      Pain Score 12/30/16 0024 5     Pain Loc --      Pain Edu? --      Excl. in GC? --     Constitutional: Alert and oriented. Well appearing and in no acute distress. Eyes: Conjunctivae are normal.  Head: Atraumatic. Gastrointestinal: Soft and nontender. No distention.  Genitourinary: 1 mm abrasion noted penile frenulum with no active bleeding at this time. Neurologic:  Normal speech and language. No gross focal neurologic deficits are appreciated.  Skin:  Skin is warm, dry.1 Millimeter abrasion noted penile frenulum no active bleeding Psychiatric: Mood and affect are normal. Speech and behavior are normal.    Procedures   ____________________________________________   INITIAL IMPRESSION / ASSESSMENT AND PLAN / ED COURSE  Pertinent  labs & imaging results that were available during my care of the patient were reviewed by me and considered in my medical decision making (see chart for details).        ____________________________________________  FINAL CLINICAL IMPRESSION(S) / ED DIAGNOSES  Final diagnoses:  Penis abrasion, initial encounter     MEDICATIONS GIVEN DURING THIS VISIT:  Medications - No data to display   NEW OUTPATIENT MEDICATIONS STARTED DURING THIS VISIT:  New Prescriptions   No medications on file    Modified Medications    No medications on file    Discontinued Medications   No medications on file     Note:  This document was prepared using Dragon voice recognition software and may include unintentional dictation errors.    Darci Current, MD 12/30/16 [phone]

## 2016-12-30 NOTE — ED Triage Notes (Addendum)
Pt in with co bleeding from penis, states during intercourse tore foreskin. States bleeding controlled at this time.

## 2019-11-21 DIAGNOSIS — R030 Elevated blood-pressure reading, without diagnosis of hypertension: Secondary | ICD-10-CM | POA: Diagnosis not present

## 2019-11-21 DIAGNOSIS — Z1159 Encounter for screening for other viral diseases: Secondary | ICD-10-CM | POA: Diagnosis not present

## 2019-11-21 DIAGNOSIS — Z0000 Encounter for general adult medical examination without abnormal findings: Secondary | ICD-10-CM | POA: Diagnosis not present

## 2019-11-21 DIAGNOSIS — E1165 Type 2 diabetes mellitus with hyperglycemia: Secondary | ICD-10-CM | POA: Diagnosis not present

## 2019-11-23 DIAGNOSIS — N481 Balanitis: Secondary | ICD-10-CM | POA: Diagnosis not present

## 2019-11-23 DIAGNOSIS — N471 Phimosis: Secondary | ICD-10-CM | POA: Diagnosis not present
